# Patient Record
Sex: Male | Born: 1990 | Hispanic: Yes | Marital: Married | Smoking: Never smoker
Health system: Southern US, Community
[De-identification: ages and names within clinical notes are randomized; demographics above are authoritative.]

---

## 2017-10-30 ENCOUNTER — Emergency Department (HOSPITAL_COMMUNITY)
Admission: EM | Admit: 2017-10-30 | Discharge: 2017-10-31 | Disposition: A | Discharge status: Home / Self Care | Payer: Self-pay | Attending: Emergency Medicine | Admitting: Emergency Medicine

## 2017-10-30 ENCOUNTER — Other Ambulatory Visit: Payer: Self-pay

## 2017-10-30 ENCOUNTER — Encounter (HOSPITAL_COMMUNITY): Payer: Self-pay | Admitting: *Deleted

## 2017-10-30 DIAGNOSIS — T7840XA Allergy, unspecified, initial encounter: Secondary | ICD-10-CM | POA: Insufficient documentation

## 2017-10-30 DIAGNOSIS — L509 Urticaria, unspecified: Secondary | ICD-10-CM

## 2017-10-30 MED ORDER — DIPHENHYDRAMINE HCL 25 MG PO CAPS
25.0000 mg | ORAL_CAPSULE | Freq: Once | ORAL | Status: AC
  Administered 2017-10-30: 25 mg via ORAL
  Filled 2017-10-30: qty 1

## 2017-10-30 NOTE — ED Triage Notes (Signed)
Pt reports he was working in the yard around 2pm today and had onset of hives. Generalized hives and itching. Denies  respiratory issues. No meds taken PTA.   Interpreter used for triage.

## 2017-10-30 NOTE — ED Notes (Signed)
To lobby to reassess pt, pt appears to have increasing hives on his extremities and now progressed to the face area. No resp issues, does c/o some pain in his jaw area.

## 2017-10-31 MED ORDER — PREDNISONE 10 MG PO TABS: 40.0000 mg | ORAL_TABLET | Freq: Every day | ORAL | 0 refills | Status: DC

## 2017-10-31 MED ORDER — FAMOTIDINE 20 MG PO TABS: 20.0000 mg | ORAL_TABLET | Freq: Every day | ORAL | 0 refills | Status: AC

## 2017-10-31 MED ORDER — EPINEPHRINE 0.3 MG/0.3ML IJ SOAJ: 0.3000 mg | Freq: Once | INTRAMUSCULAR | 0 refills | Status: AC

## 2017-10-31 MED ORDER — DIPHENHYDRAMINE HCL 25 MG PO TABS: 25.0000 mg | ORAL_TABLET | Freq: Four times a day (QID) | ORAL | 0 refills | Status: AC | PRN

## 2017-10-31 MED ORDER — FAMOTIDINE 20 MG PO TABS
20.0000 mg | ORAL_TABLET | Freq: Once | ORAL | Status: AC
  Administered 2017-10-31: 20 mg via ORAL
  Filled 2017-10-31: qty 1

## 2017-10-31 MED ORDER — PREDNISONE 20 MG PO TABS
60.0000 mg | ORAL_TABLET | Freq: Once | ORAL | Status: AC
  Administered 2017-10-31: 60 mg via ORAL
  Filled 2017-10-31: qty 3

## 2017-10-31 NOTE — ED Provider Notes (Signed)
[facility] Note  CSN: [id number] Arrival date & time: 10/30/17 2134  Chief Complaint(s) Urticaria  HPI Abigail ButtsJose Pena Rivas is a 27 y.o. male who presents to the emergency department with 10 hours of urticaria.  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  Following this he began having urticaria which began in the face and spread throughout the body.  He denies any associated headache, nausea, vomiting, abdominal pain, chest pain, shortness of breath, oropharynx swelling.  Patient denies any known allergies.  He is unsure what bit him but believes it might of been a spider.  No alleviating or aggravating factors.  He denies any other physical complaints at this time.  HPI  Past Medical History History reviewed. No pertinent past medical history. There are no active problems to display for this patient.  Home Medication(s) Prior to Admission medications   Medication Sig Start Date End Date Taking? Authorizing Provider  diphenhydrAMINE (BENADRYL) 25 MG tablet Take 1 tablet (25 mg total) by mouth every 6 (six) hours as needed for up to 5 days for itching (and hives (y ronchas)). 10/31/17 11/05/17  Pearley Millington, Amadeo GarnetPedro Eduardo, MD  EPINEPHrine 0.3 mg/0.3 mL IJ SOAJ injection Inject 0.3 mLs (0.3 mg total) into the muscle once for 1 dose. 10/31/17 10/31/17  Nira Connardama, Dolton Shaker Eduardo, MD  famotidine (PEPCID) 20 MG tablet Take 1 tablet (20 mg total) by mouth daily for 5 days. 10/31/17 11/05/17  Nira Connardama, Jonnette Nuon Eduardo, MD  predniSONE (DELTASONE) 10 MG tablet Take 4 tablets (40 mg total) by mouth daily for 4 days. 10/31/17 11/04/17  Nira Connardama, Derika Eckles Eduardo, MD                                                                                                                                    Past Surgical History History reviewed. No pertinent surgical history. Family History No family  history on file.  Social History Social History   Tobacco Use  . Smoking status: Never Smoker  Substance Use Topics  . Alcohol use: Yes    Frequency: Never  . Drug use: Never   Allergies Patient has no known allergies.  Review of Systems Review of Systems All other systems are reviewed and are negative for acute change except as noted in the HPI  Physical Exam Vital Signs  I have reviewed the triage vital signs BP (!) 156/69 (BP Location: Right Arm)   Pulse 62   Temp 98.1 F (36.7 C) (Oral)   Resp 18   Ht 5\' 6"  (1.676 m)   Wt 63.5 kg   SpO2 99%   BMI 22.60 kg/m   Physical Exam  Constitutional: He is oriented to person, place, and time. He appears well-developed and well-nourished. No distress.  HENT:  Head: Normocephalic and atraumatic.  Nose: Nose  normal.  Mouth/Throat: No uvula swelling. No posterior oropharyngeal edema or posterior oropharyngeal erythema.  Eyes: Pupils are equal, round, and reactive to light. Conjunctivae and EOM are normal. Right eye exhibits no discharge. Left eye exhibits no discharge. No scleral icterus.  Neck: Normal range of motion. Neck supple.  Cardiovascular: Normal rate and regular rhythm. Exam reveals no gallop and no friction rub.  No murmur heard. Pulmonary/Chest: Effort normal and breath sounds normal. No stridor. No respiratory distress. He has no rales.  Abdominal: Soft. He exhibits no distension. There is no tenderness.  Musculoskeletal: He exhibits no edema or tenderness.  Neurological: He is alert and oriented to person, place, and time.  Skin: Skin is warm and dry. Rash noted. Rash is urticarial (to face, neck, torso, BUE and BLE). He is not diaphoretic. No erythema.  Psychiatric: He has a normal mood and affect.  Vitals reviewed.   ED Results and Treatments Labs (all labs ordered are listed, but only abnormal results are displayed) Labs Reviewed - No data to display                                                                                                                        EKG  EKG Interpretation  Date/Time:    Ventricular Rate:    PR Interval:    QRS Duration:   QT Interval:    QTC Calculation:   R Axis:     Text Interpretation:        Radiology No results found. Pertinent labs & imaging results that were available during my care of the patient were reviewed by me and considered in my medical decision making (see chart for details).  Medications Ordered in ED Medications  famotidine (PEPCID) tablet 20 mg (has no administration in time range)  predniSONE (DELTASONE) tablet 60 mg (has no administration in time range)  diphenhydrAMINE (BENADRYL) capsule 25 mg (25 mg Oral Given 10/30/17 2214)                                                                                                                                    Procedures Procedures  (including critical care time)  Medical Decision Making / ED Course I have reviewed the nursing notes for this encounter and the patient's prior records (if available in EHR or on provided paperwork).    27 y.o. male here with pruritic rash. No known triggers or  exposures. No respiratory, GI, or neurologic symptoms to suggest anaphylaxis. No recent infectious symptoms suggestive of viral urticaria.  Patient has not taken benadryl prior to arrival.   On exam, there is no evidence of oral swelling or airway compromise.   Given Benadryl, H2 blocker, and steroids.  It has already been 10 hours since symptom onset and patient does not require additional monitoring.  Safe for discharge with strict return precautions. Given Rx for H2 blocker and steroids.   Final Clinical Impression(s) / ED Diagnoses Final diagnoses:  Urticaria  Allergic reaction, initial encounter   Disposition: Discharge  Condition: Good  I have discussed the results, Dx and Tx plan with the patient who expressed understanding and agree(s) with the plan. Discharge instructions  discussed at great length. The patient was given strict return precautions who verbalized understanding of the instructions. No further questions at time of discharge.    ED Discharge Orders         Ordered    famotidine (PEPCID) 20 MG tablet  Daily     10/31/17 0054    predniSONE (DELTASONE) 10 MG tablet  Daily     10/31/17 0054    diphenhydrAMINE (BENADRYL) 25 MG tablet  Every 6 hours PRN     10/31/17 0054    EPINEPHrine 0.3 mg/0.3 mL IJ SOAJ injection   Once     10/31/17 [id number]           Follow Up: Doctor cabecera   Si no tiene doctor cabecera puede llamar a [facility] para ayuda en obtener uno.  Doctor Vonda Antiguae Alergias   Alan Mulderbtanga una cita con un doctor de alergias para saber a que le tiene alergia      This chart was dictated using Chemical engineervoice recognition software.  Despite best efforts to proofread,  errors can occur which can change the documentation meaning.   Nira Connardama, Kyal Arts Eduardo, MD 10/31/17 [phone]

## 2017-11-01 ENCOUNTER — Emergency Department (HOSPITAL_COMMUNITY)
Admission: EM | Admit: 2017-11-01 | Discharge: 2017-11-01 | Disposition: A | Discharge status: Home / Self Care | Payer: Self-pay | Attending: Emergency Medicine | Admitting: Emergency Medicine

## 2017-11-01 ENCOUNTER — Other Ambulatory Visit: Payer: Self-pay

## 2017-11-01 ENCOUNTER — Encounter (HOSPITAL_COMMUNITY): Payer: Self-pay

## 2017-11-01 DIAGNOSIS — T782XXA Anaphylactic shock, unspecified, initial encounter: Secondary | ICD-10-CM

## 2017-11-01 DIAGNOSIS — T7840XA Allergy, unspecified, initial encounter: Secondary | ICD-10-CM | POA: Insufficient documentation

## 2017-11-01 DIAGNOSIS — R0600 Dyspnea, unspecified: Secondary | ICD-10-CM | POA: Insufficient documentation

## 2017-11-01 MED ORDER — FAMOTIDINE IN NACL 20-0.9 MG/50ML-% IV SOLN
20.0000 mg | Freq: Once | INTRAVENOUS | Status: AC
  Administered 2017-11-01: 20 mg via INTRAVENOUS
  Filled 2017-11-01: qty 50

## 2017-11-01 MED ORDER — LACTATED RINGERS IV BOLUS
1000.0000 mL | Freq: Once | INTRAVENOUS | Status: AC
  Administered 2017-11-01: 1000 mL via INTRAVENOUS

## 2017-11-01 MED ORDER — EPINEPHRINE 0.3 MG/0.3ML IJ SOAJ
0.3000 mg | Freq: Once | INTRAMUSCULAR | Status: AC
Start: 2017-11-01 — End: 2017-11-01
  Administered 2017-11-01: 0.3 mg via INTRAMUSCULAR
  Filled 2017-11-01: qty 0.3

## 2017-11-01 MED ORDER — DIPHENHYDRAMINE HCL 50 MG/ML IJ SOLN
25.0000 mg | Freq: Once | INTRAMUSCULAR | Status: AC
  Administered 2017-11-01: 25 mg via INTRAVENOUS
  Filled 2017-11-01: qty 1

## 2017-11-01 MED ORDER — EPINEPHRINE 0.3 MG/0.3ML IJ SOAJ: 0.3000 mg | Freq: Once | INTRAMUSCULAR | 1 refills | Status: AC

## 2017-11-01 MED ORDER — PREDNISONE 10 MG PO TABS: 40.0000 mg | ORAL_TABLET | Freq: Every day | ORAL | 0 refills | Status: AC

## 2017-11-01 MED ORDER — METHYLPREDNISOLONE SODIUM SUCC 125 MG IJ SOLR
125.0000 mg | Freq: Once | INTRAMUSCULAR | Status: AC
  Administered 2017-11-01: 125 mg via INTRAVENOUS
  Filled 2017-11-01: qty 2

## 2017-11-01 NOTE — ED Provider Notes (Signed)
[facility]-EM[facility] Provider Note   CSN: [id number] Arrival date & time: 11/01/17  1613     History   Chief Complaint Chief Complaint  Patient presents with  . Urticaria  . Allergic Reaction    HPI Son Barkan is a 27 y.o. male.  The history is provided by the patient.  Allergic Reaction  Presenting symptoms: difficulty breathing, itching, rash, swelling and wheezing   Severity:  Severe Prior allergic episodes:  Insect allergies and plant allergies Context: grass and insect bite/sting   Relieved by:  None tried Worsened by:  Nothing Ineffective treatments:  None tried   History reviewed. No pertinent past medical history.  There are no active problems to display for this patient.   History reviewed. No pertinent surgical history.      Home Medications    Prior to Admission medications   Medication Sig Start Date End Date Taking? Authorizing Provider  diphenhydrAMINE (BENADRYL) 25 MG tablet Take 1 tablet (25 mg total) by mouth every 6 (six) hours as needed for up to 5 days for itching (and hives (y ronchas)). 10/31/17 11/05/17 Yes Cardama, Amadeo Garnet, MD  famotidine (PEPCID) 20 MG tablet Take 1 tablet (20 mg total) by mouth daily for 5 days. 10/31/17 11/05/17 Yes Cardama, Amadeo Garnet, MD  EPINEPHrine 0.3 mg/0.3 mL IJ SOAJ injection Inject 0.3 mLs (0.3 mg total) into the muscle once for 1 dose. 11/01/17 11/01/17  Lowen Barringer, DO  predniSONE (DELTASONE) 10 MG tablet Take 4 tablets (40 mg total) by mouth daily for 4 days. 11/01/17 11/05/17  Virgina Norfolk, DO    Family History No family history on file.  Social History Social History   Tobacco Use  . Smoking status: Never Smoker  . Smokeless tobacco: Never Used  Substance Use Topics  . Alcohol use: Yes    Frequency: Never  . Drug use: Never     Allergies   Patient has no known allergies.   Review of Systems Review of Systems  Constitutional: Negative for chills and  fever.  HENT: Negative for ear pain and sore throat.   Eyes: Negative for pain and visual disturbance.  Respiratory: Positive for shortness of breath and wheezing. Negative for cough.   Cardiovascular: Negative for chest pain and palpitations.  Gastrointestinal: Positive for nausea. Negative for abdominal pain and vomiting.  Genitourinary: Negative for dysuria and hematuria.  Musculoskeletal: Negative for arthralgias and back pain.  Skin: Positive for itching and rash. Negative for color change.  Neurological: Negative for seizures and syncope.  All other systems reviewed and are negative.    Physical Exam Updated Vital Signs  ED Triage Vitals  Enc Vitals Group     BP 11/01/17 1629 (!) 88/72     Pulse Rate 11/01/17 1629 (!) 112     Resp 11/01/17 1629 20     Temp 11/01/17 1629 98.7 F (37.1 C)     Temp Source 11/01/17 1629 Oral     SpO2 11/01/17 1629 100 %     Weight 11/01/17 1641 139 lb 15.9 oz (63.5 kg)     Height 11/01/17 1641 5\' 6"  (1.676 m)     Head Circumference --      Peak Flow --      Pain Score 11/01/17 1640 8     Pain Loc --      Pain Edu? --      Excl. in GC? --     Physical Exam  Constitutional: He appears well-developed  and well-nourished.  HENT:  Head: Normocephalic and atraumatic.  Mouth/Throat: No oropharyngeal exudate.  No swelling of lips or toungue  Eyes: Pupils are equal, round, and reactive to light. Conjunctivae and EOM are normal.  Neck: Normal range of motion. Neck supple.  Cardiovascular: Normal rate, regular rhythm, normal heart sounds and intact distal pulses.  No murmur heard. Pulmonary/Chest: Effort normal. No respiratory distress. He has wheezes.  Abdominal: Soft. He exhibits no distension. There is no tenderness.  Musculoskeletal: Normal range of motion. He exhibits no edema.  Neurological: He is alert.  Skin: Skin is warm and dry. Rash (hives throughout) noted.  Psychiatric: He has a normal mood and affect.  Nursing note and vitals  reviewed.    ED Treatments / Results  Labs (all labs ordered are listed, but only abnormal results are displayed) Labs Reviewed - No data to display  EKG None  Radiology No results found.  Procedures .Critical Care Performed by: Virgina Norfolk, DO Authorized by: Virgina Norfolk, DO   Critical care provider statement:    Critical care time (minutes):  35   Critical care was necessary to treat or prevent imminent or life-threatening deterioration of the following conditions:  Circulatory failure   Critical care was time spent personally by me on the following activities:  Development of treatment plan with patient or surrogate, evaluation of patient's response to treatment, examination of patient, pulse oximetry and re-evaluation of patient's condition   I assumed direction of critical care for this patient from another provider in my specialty: no     (including critical care time)  Medications Ordered in ED Medications  lactated ringers bolus 1,000 mL (0 mLs Intravenous Stopped 11/01/17 2024)  diphenhydrAMINE (BENADRYL) injection 25 mg (25 mg Intravenous Given 11/01/17 1701)  EPINEPHrine (EPI-PEN) injection 0.3 mg (0.3 mg Intramuscular Given 11/01/17 1655)  methylPREDNISolone sodium succinate (SOLU-MEDROL) 125 mg/2 mL injection 125 mg (125 mg Intravenous Given 11/01/17 1703)  famotidine (PEPCID) IVPB 20 mg premix (0 mg Intravenous Stopped 11/01/17 1854)     Initial Impression / Assessment and Plan / ED Course  I have reviewed the triage vital signs and the nursing notes.  Pertinent labs & imaging results that were available during my care of the patient were reviewed by me and considered in my medical decision making (see chart for details).     Stepen Christohper Dube is a 27 year old male with no significant medical history who presents to the ED with rash.  Patient with hypotension and hypoxia upon arrival.  Given IM dose of epinephrine as patient in anaphylactic shock.  Patient  with improvement following epinephrine and IV fluids.  Patient given IV Benadryl, IV Solu-Medrol, and IV Pepcid.  Patient was here last several days with a similar presentation.  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  Patient possibly allergic to grass or specific insect possibly bee.  Patient with rash, nausea, hypotension, hypoxia, hives throughout consistent with anaphylactic reaction.  Was observed in the ED for 4 hours and had resolution of symptoms.  Contacted case management by phone and use interpreter to help educate the patient and to help provide him cheap prescriptions for epinephrine.  Patient had a prescription for epinephrine given at last visit however cannot afford them.  Arrangements were made to help get medications for $3.  Patient was given follow-up with wellness center and discharged from the ED in good condition.  Patient will benefit from seeing an allergy doctor. Given return precuations.  Final Clinical Impressions(s) / ED Diagnoses   Final diagnoses:  Anaphylaxis, initial encounter    ED Discharge Orders         Ordered    predniSONE (DELTASONE) 10 MG tablet  Daily     11/01/17 2059    EPINEPHrine 0.3 mg/0.3 mL IJ SOAJ injection   Once     11/01/17 2059           Virgina NorfolkCuratolo, Mandolin Falwell, DO 11/01/17 2104

## 2017-11-01 NOTE — Care Management Note (Signed)
Case Management Note  Patient Details  Name: Douglas Walters MRN: [id number] Date of Birth: 04/09/1990  Subjective/Objective:                  Allergic Reaction  Action/Plan: ED CM spoke with the EDP and patient over the telephone with an interpreter present in the patient's room. Patient needs assistance with his medications and needs a PCP. [facility] to the patient and [facility] to [facility] on [address] (patient's preferred pharmacy). Fax confirmation received for both.   Expected Discharge Date:     11/01/17           Expected Discharge Plan:  Home/Self Care  In-House Referral:     Discharge planning Services  CM Consult, MATCH Program, Medication Assistance  Post Acute Care Choice:    Choice offered to:     DME Arranged:    DME Agency:     HH Arranged:    HH Agency:     Status of Service:  Completed, signed off  If discussed at [employer] Meetings, dates discussed:    Additional Comments:  Antony HasteBennett, Ragina Fenter Harris, RN 11/01/2017, 9:07 PM

## 2017-11-01 NOTE — ED Triage Notes (Signed)
Patient was seen on 10/30/17 for hives. patient did not get the Epipen filled due to cost. Patient has increased hives, swelling and has throat pain. Patient is able to swallow.  Triage was completed with Interpreter-Douglas Walters.

## 2017-11-01 NOTE — ED Notes (Signed)
Bed: WA03 Expected date:  Expected time:  Means of arrival:  Comments: Hold for triage 1 

## 2017-11-02 ENCOUNTER — Telehealth: Payer: Self-pay | Admitting: Emergency Medicine

## 2017-11-02 NOTE — Telephone Encounter (Addendum)
CM contacted by [facility].  He was having trouble with [facility] going [facility].  Checked [facility] and the numbers and name were typed incorrectly.  Provided him with the correct number and updated the [facility].    Received another call from [place] reporting the EpiPen will not process through [facility].  CM reviewed [facility] and [facility] that matches the epipens.  Medication would still not go through.  Nedra HaiLee reported he will call [facility] and possibly the [facility]ies for further assistance.  CM also suggest that pt go to an outpt pharmacy for further assistance if Nedra HaiLee was unable to get the prescription to process.    No further CM needs noted at this time.

## 2017-11-07 ENCOUNTER — Other Ambulatory Visit: Payer: Self-pay

## 2017-11-07 ENCOUNTER — Encounter (HOSPITAL_COMMUNITY): Payer: Self-pay

## 2017-11-07 ENCOUNTER — Emergency Department (HOSPITAL_COMMUNITY)
Admission: EM | Admit: 2017-11-07 | Discharge: 2017-11-07 | Disposition: A | Discharge status: Home / Self Care | Payer: Self-pay | Attending: Emergency Medicine | Admitting: Emergency Medicine

## 2017-11-07 DIAGNOSIS — Y999 Unspecified external cause status: Secondary | ICD-10-CM | POA: Insufficient documentation

## 2017-11-07 DIAGNOSIS — S0990XA Unspecified injury of head, initial encounter: Secondary | ICD-10-CM | POA: Insufficient documentation

## 2017-11-07 DIAGNOSIS — Y939 Activity, unspecified: Secondary | ICD-10-CM | POA: Insufficient documentation

## 2017-11-07 DIAGNOSIS — Y929 Unspecified place or not applicable: Secondary | ICD-10-CM | POA: Insufficient documentation

## 2017-11-07 MED ORDER — IBUPROFEN 800 MG PO TABS: 800.0000 mg | ORAL_TABLET | Freq: Four times a day (QID) | ORAL | 0 refills | Status: AC | PRN
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
## 2020-01-17 ENCOUNTER — Other Ambulatory Visit: Payer: Self-pay

## 2020-01-17 ENCOUNTER — Encounter (HOSPITAL_COMMUNITY): Payer: Self-pay

## 2020-01-17 ENCOUNTER — Emergency Department (HOSPITAL_COMMUNITY)
Admission: EM | Admit: 2020-01-17 | Discharge: 2020-01-17 | Disposition: A | Discharge status: Home / Self Care | Payer: Self-pay | Attending: Emergency Medicine | Admitting: Emergency Medicine

## 2020-01-17 ENCOUNTER — Emergency Department (HOSPITAL_COMMUNITY): Payer: Self-pay

## 2020-01-17 DIAGNOSIS — Y9389 Activity, other specified: Secondary | ICD-10-CM | POA: Insufficient documentation

## 2020-01-17 DIAGNOSIS — S52124A Nondisplaced fracture of head of right radius, initial encounter for closed fracture: Secondary | ICD-10-CM | POA: Insufficient documentation

## 2020-01-17 DIAGNOSIS — W19XXXA Unspecified fall, initial encounter: Secondary | ICD-10-CM | POA: Insufficient documentation

## 2020-01-17 MED ORDER — HYDROCODONE-ACETAMINOPHEN 5-325 MG PO TABS
1.0000 | ORAL_TABLET | Freq: Four times a day (QID) | ORAL | 0 refills | Status: AC | PRN
Start: 2020-01-17 — End: ?

## 2020-01-17 NOTE — ED Triage Notes (Signed)
Pt c/o right arm pain, from elbow down after falling while playing with son yesterday. (+) CSM, unable to fully straighten arm without extreme pain.

## 2020-01-17 NOTE — Discharge Instructions (Addendum)
Toma tylenol e ibuprofen por dolor.  Toma norco por dolor mas grave. Ten cuidado, puede causar cansado. No maneja cuando esta tomando esa pastilla.  [place] el esling si necesita por dolor.  [place] hielo por inflamacion y Engineer, mining.  Llama la officina debajo el lunes para dar una cita.  Regresa a la sala de emergencia si tiene sintomas empeorando.

## 2020-01-17 NOTE — ED Provider Notes (Signed)
[facility]-EMERGENCY DEPT Provider Note   CSN: [id number] Arrival date & time: 01/17/20  1641     History Chief Complaint  Patient presents with  . Arm Injury    Douglas Walters is a 29 y.o. male presented for evaluation of right arm pain.  Patient states he was playing yesterday with his son when he fell, landing on his right arm.  He did not hit his head or lose consciousness.  He reports since then, he has had pain of his right arm, from his elbow to his hand.  Pain is significantly worse when he tries to straighten out his arm.  He took Tylenol yesterday for pain, has not taken anything for it pain today.  Pain is worse today.  He denies numbness or tingling.  No pain in his shoulder, head, neck, or back.  No pain the left side.  No previous history of problems.  He has no other medical problems, takes no other medications daily.  HPI     History reviewed. No pertinent past medical history.  There are no problems to display for this patient.   History reviewed. No pertinent surgical history.     History reviewed. No pertinent family history.  Social History   Tobacco Use  . Smoking status: Never Smoker  . Smokeless tobacco: Never Used  Vaping Use  . Vaping Use: Never used  Substance Use Topics  . Alcohol use: Yes  . Drug use: Never    Home Medications Prior to Admission medications   Medication Sig Start Date End Date Taking? Authorizing Provider  acetaminophen (TYLENOL) 500 MG tablet Take 1,000 mg by mouth 2 (two) times daily as needed for moderate pain.    [provider]  diphenhydrAMINE (BENADRYL) 25 MG tablet Take 1 tablet (25 mg total) by mouth every 6 (six) hours as needed for up to 5 days for itching (and hives (y ronchas)). Patient not taking: Reported on 11/07/2017 10/31/17 11/05/17  Nira Conn, MD  famotidine (PEPCID) 20 MG tablet Take 1 tablet (20 mg total) by mouth daily for 5 days. Patient not taking:  Reported on 11/07/2017 10/31/17 11/05/17  Nira Conn, MD  HYDROcodone-acetaminophen (NORCO/VICODIN) 5-325 MG tablet Take 1 tablet by mouth every 6 (six) hours as needed for severe pain. 01/17/20   Viney Acocella, PA-C  ibuprofen (ADVIL,MOTRIN) 800 MG tablet Take 1 tablet (800 mg total) by mouth every 6 (six) hours as needed for headache. 11/07/17   Pollina, Canary Brim, MD    Allergies    Patient has no known allergies.  Review of Systems   Review of Systems  Musculoskeletal: Positive for arthralgias and joint swelling.  Neurological: Negative for numbness.  Hematological: Does not bruise/bleed easily.    Physical Exam Updated Vital Signs BP 121/67 (BP Location: Left Arm)   Pulse 74   Temp 98 F (36.7 C) (Oral)   Resp 17   SpO2 100%   Physical Exam Vitals and nursing note reviewed.  Constitutional:      General: He is not in acute distress.    Appearance: He is well-developed.  HENT:     Head: Normocephalic and atraumatic.  Pulmonary:     Effort: Pulmonary effort is normal.  Abdominal:     General: There is no distension.  Musculoskeletal:        General: Swelling and tenderness present.     Cervical back: Normal range of motion.     Comments: Mild swelling of the  right arm.  Radial pulses 2+ bilaterally.  Grip strength equal bilaterally.  Patient unable to fully extend his right elbow due to pain.  Full active range of motion of the wrist and fingers without pain.  No tenderness palpation of the right shoulder.  Good distal sensation and cap refill.  Skin:    General: Skin is warm.     Capillary Refill: Capillary refill takes less than 2 seconds.     Findings: No rash.  Neurological:     Mental Status: He is alert and oriented to person, place, and time.     ED Results / Procedures / Treatments   Labs (all labs ordered are listed, but only abnormal results are displayed) Labs Reviewed - No data to display  EKG None  Radiology DG Elbow Complete  Right  Result Date: 01/17/2020 CLINICAL DATA:  Larey Seat while playing with son yesterday, RIGHT arm pain from elbow to hand EXAM: RIGHT ELBOW - COMPLETE 3+ VIEW COMPARISON:  None FINDINGS: Osseous mineralization normal. Joint spaces preserved. Nondisplaced intra-articular fracture RIGHT radial head with associated joint effusion. No additional fracture, dislocation, or bone destruction. IMPRESSION: Nondisplaced intra-articular fracture RIGHT radial head with associated elbow joint effusion. Electronically Signed   By: Ulyses Southward M.D.   On: 01/17/2020 19:02   DG Forearm Right  Result Date: 01/17/2020 CLINICAL DATA:  Larey Seat while playing with son yesterday, RIGHT arm pain from elbow to hand EXAM: RIGHT FOREARM - 2 VIEW COMPARISON:  None FINDINGS: Osseous mineralization normal. Joint spaces preserved. Nondisplaced intra-articular fracture RIGHT radial head. Associated elbow joint effusion. No additional fracture, dislocation, or bone destruction. IMPRESSION: Nondisplaced intra-articular fracture RIGHT radial head with associated elbow joint effusion. Electronically Signed   By: Ulyses Southward M.D.   On: 01/17/2020 18:59   DG Wrist Complete Right  Result Date: 01/17/2020 CLINICAL DATA:  Larey Seat while playing with son yesterday, RIGHT arm pain from elbow to hand EXAM: RIGHT WRIST - COMPLETE 3+ VIEW COMPARISON:  None FINDINGS: Osseous mineralization normal. Joint spaces preserved. No fracture, dislocation, or bone destruction. IMPRESSION: Normal exam. Electronically Signed   By: Ulyses Southward M.D.   On: 01/17/2020 18:57   DG Hand Complete Right  Result Date: 01/17/2020 CLINICAL DATA:  Larey Seat while playing with son yesterday, RIGHT arm pain from elbow to hand EXAM: RIGHT HAND - COMPLETE 3+ VIEW COMPARISON:  None FINDINGS: Osseous mineralization normal. Joint spaces preserved. No acute fracture, dislocation, or bone destruction. IMPRESSION: Normal exam. Electronically Signed   By: Ulyses Southward M.D.   On: 01/17/2020 19:03     Procedures Procedures (including critical care time)  Medications Ordered in ED Medications - No data to display  ED Course  I have reviewed the triage vital signs and the nursing notes.  Pertinent labs & imaging results that were available during my care of the patient were reviewed by me and considered in my medical decision making (see chart for details).    MDM Rules/Calculators/A&P                          Patient presenting for evaluation of right arm pain.  On exam, patient is neurovascularly intact.  However he does have limited range of motion due to pain.  Concern for possible fracture, will obtain x-rays.  X-rays viewed interpreted by me, shows nondisplaced radial head fracture.  Will place patient in sling due to pain, discussed pain control with Tylenol, ibuprofen, Norco as needed for severe breakthrough  pain.  Discussed importance of follow-up with orthopedics.  At this time, patient appears safe for discharge.  Return precautions given.  Patient states he understands and agrees to plan.  Final Clinical Impression(s) / ED Diagnoses Final diagnoses:  Closed nondisplaced fracture of head of right radius, initial encounter  Fall, initial encounter    Rx / DC Orders ED Discharge Orders         Ordered    HYDROcodone-acetaminophen (NORCO/VICODIN) 5-325 MG tablet  Every 6 hours PRN        01/17/20 1921           Alveria Apley, PA-C 01/17/20 1958    Pricilla Loveless, MD 01/17/20 2205

## 2022-11-05 IMAGING — CR DG ELBOW COMPLETE 3+V*R*
4 series · 4 of 4 positions shown · non-contrast
Comparison: None

CLINICAL DATA: Fell while playing with son yesterday, RIGHT arm
pain from elbow to hand

EXAM:
RIGHT ELBOW - COMPLETE 3+ VIEW

[x elbow lat right]
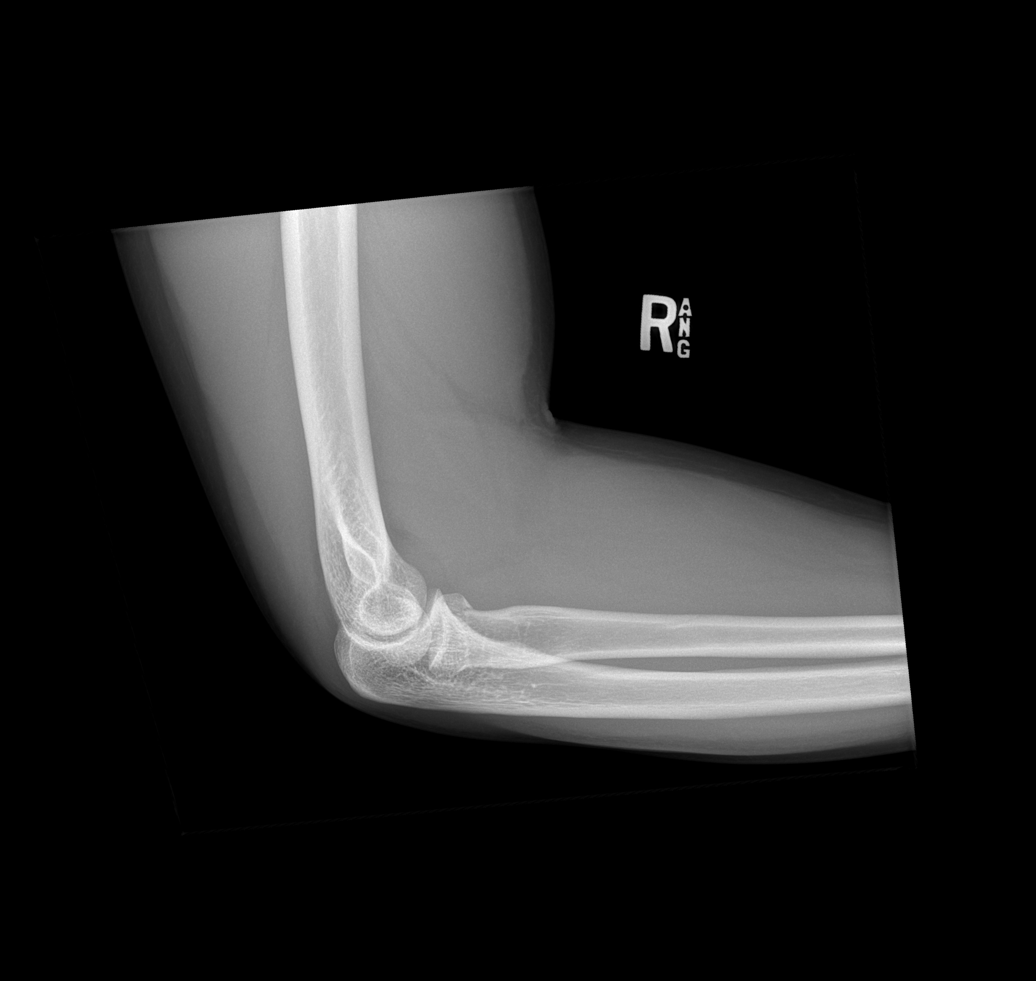

[x elbow ap right]
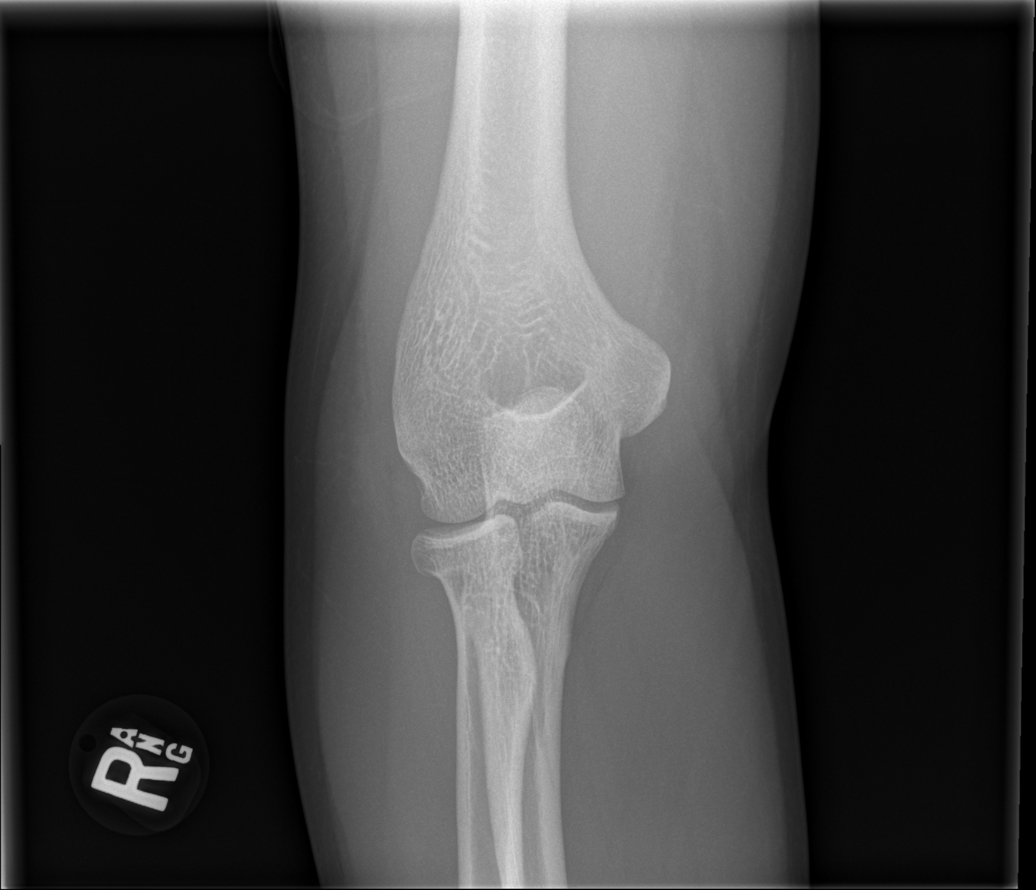

[x elbow obl right (1 of 2)]
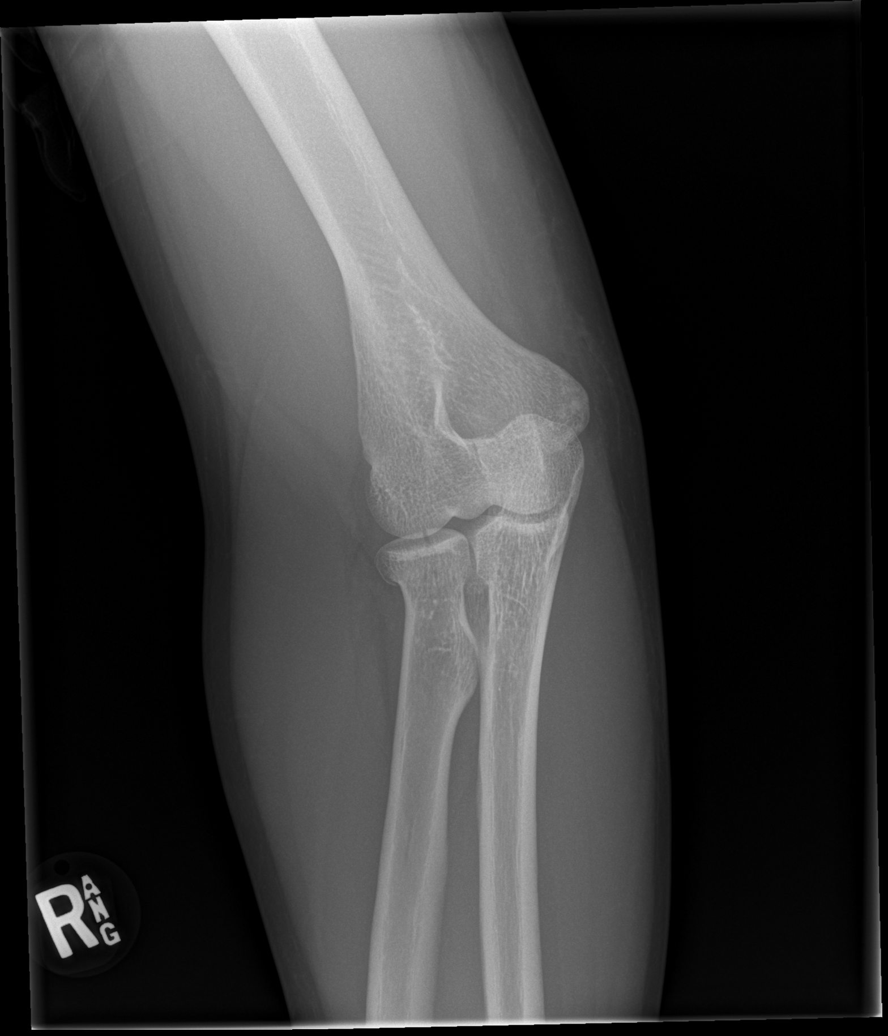

[x elbow obl right (2 of 2)]
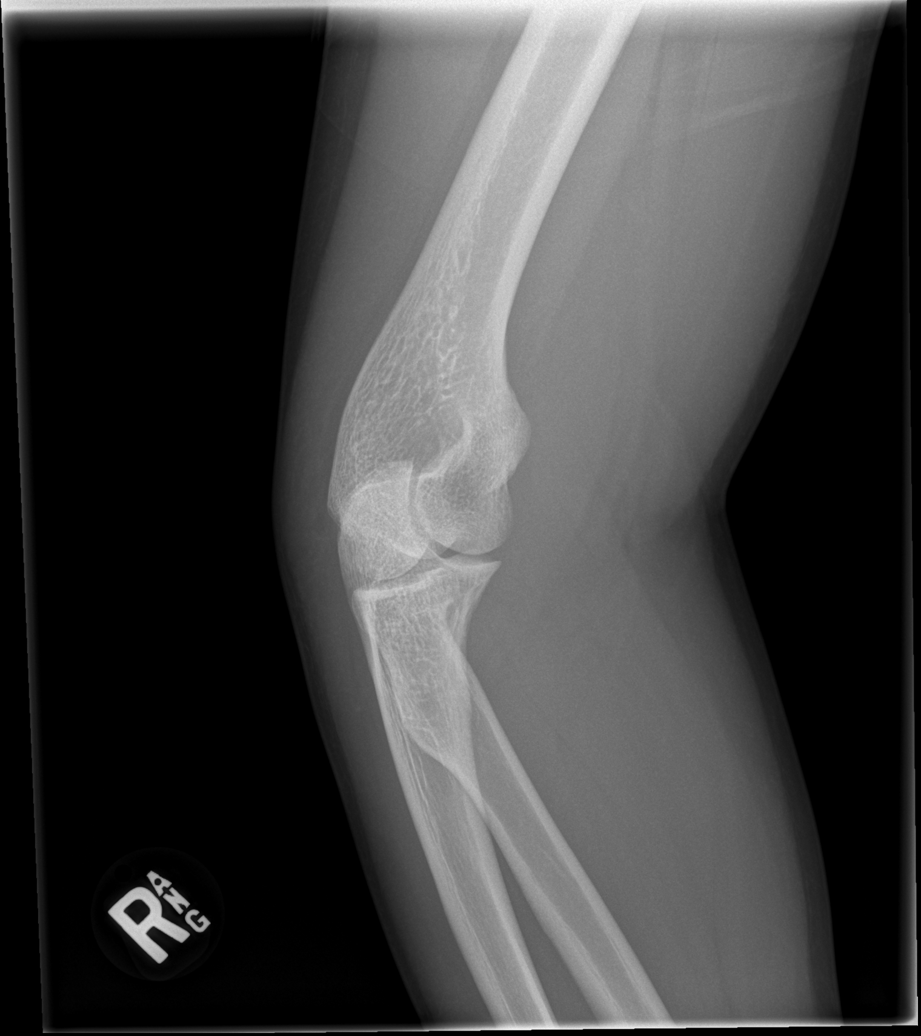

[4 of 4 positions shown; findings below may reference images not displayed]

FINDINGS: Osseous mineralization normal.

Joint spaces preserved.

Nondisplaced intra-articular fracture RIGHT radial head with
associated joint effusion.

No additional fracture, dislocation, or bone destruction.
IMPRESSION: Nondisplaced intra-articular fracture RIGHT radial head with
associated elbow joint effusion.
